# Patient Record
Sex: Male | Born: 1975 | Hispanic: Yes | Marital: Married | Smoking: Never smoker
Health system: Southern US, Community
[De-identification: ages and names within clinical notes are randomized; demographics above are authoritative.]

## PROBLEM LIST (undated history)

## (undated) DIAGNOSIS — E785 Hyperlipidemia, unspecified: Secondary | ICD-10-CM

## (undated) DIAGNOSIS — G43009 Migraine without aura, not intractable, without status migrainosus: Secondary | ICD-10-CM

## (undated) HISTORY — DX: Migraine without aura, not intractable, without status migrainosus: G43.009

## (undated) HISTORY — PX: WISDOM TOOTH EXTRACTION: SHX21

---

## 2015-06-28 ENCOUNTER — Encounter: Payer: Self-pay | Admitting: Emergency Medicine

## 2015-06-28 ENCOUNTER — Emergency Department: Payer: 59

## 2015-06-28 ENCOUNTER — Emergency Department
Admission: EM | Admit: 2015-06-28 | Discharge: 2015-06-28 | Disposition: A | Discharge status: Home / Self Care | Payer: 59 | Attending: Emergency Medicine | Admitting: Emergency Medicine

## 2015-06-28 DIAGNOSIS — Y9389 Activity, other specified: Secondary | ICD-10-CM | POA: Insufficient documentation

## 2015-06-28 DIAGNOSIS — T148 Other injury of unspecified body region: Secondary | ICD-10-CM | POA: Diagnosis not present

## 2015-06-28 DIAGNOSIS — S0001XA Abrasion of scalp, initial encounter: Secondary | ICD-10-CM | POA: Insufficient documentation

## 2015-06-28 DIAGNOSIS — S0990XA Unspecified injury of head, initial encounter: Secondary | ICD-10-CM | POA: Diagnosis not present

## 2015-06-28 DIAGNOSIS — Y92410 Unspecified street and highway as the place of occurrence of the external cause: Secondary | ICD-10-CM | POA: Diagnosis not present

## 2015-06-28 DIAGNOSIS — T07XXXA Unspecified multiple injuries, initial encounter: Secondary | ICD-10-CM

## 2015-06-28 DIAGNOSIS — S29001A Unspecified injury of muscle and tendon of front wall of thorax, initial encounter: Secondary | ICD-10-CM | POA: Diagnosis not present

## 2015-06-28 DIAGNOSIS — S8992XA Unspecified injury of left lower leg, initial encounter: Secondary | ICD-10-CM | POA: Insufficient documentation

## 2015-06-28 DIAGNOSIS — S8991XA Unspecified injury of right lower leg, initial encounter: Secondary | ICD-10-CM | POA: Insufficient documentation

## 2015-06-28 DIAGNOSIS — M62838 Other muscle spasm: Secondary | ICD-10-CM | POA: Insufficient documentation

## 2015-06-28 DIAGNOSIS — Y998 Other external cause status: Secondary | ICD-10-CM | POA: Insufficient documentation

## 2015-06-28 DIAGNOSIS — S199XXA Unspecified injury of neck, initial encounter: Secondary | ICD-10-CM | POA: Diagnosis present

## 2015-06-28 HISTORY — DX: Hyperlipidemia, unspecified: E78.5

## 2015-06-28 MED ORDER — METHOCARBAMOL 500 MG PO TABS: 500.0000 mg | ORAL_TABLET | Freq: Four times a day (QID) | ORAL | Status: DC

## 2015-06-28 MED ORDER — MELOXICAM 15 MG PO TABS: 15.0000 mg | ORAL_TABLET | Freq: Every day | ORAL | Status: DC
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
## 2015-06-28 NOTE — Discharge Instructions (Signed)
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
## 2018-03-07 ENCOUNTER — Encounter: Payer: Self-pay | Admitting: Cardiology

## 2018-03-08 ENCOUNTER — Other Ambulatory Visit: Payer: Self-pay | Admitting: Internal Medicine

## 2018-03-08 ENCOUNTER — Encounter (INDEPENDENT_AMBULATORY_CARE_PROVIDER_SITE_OTHER): Payer: Self-pay

## 2018-03-08 ENCOUNTER — Ambulatory Visit (INDEPENDENT_AMBULATORY_CARE_PROVIDER_SITE_OTHER): Payer: 59

## 2018-03-08 DIAGNOSIS — R55 Syncope and collapse: Secondary | ICD-10-CM | POA: Diagnosis not present

## 2018-03-28 ENCOUNTER — Encounter: Payer: Self-pay | Admitting: Cardiology

## 2018-03-28 ENCOUNTER — Ambulatory Visit (INDEPENDENT_AMBULATORY_CARE_PROVIDER_SITE_OTHER): Payer: 59 | Admitting: Cardiology

## 2018-03-28 VITALS — BP 120/70 | HR 58 | Ht 76.0 in | Wt 208.4 lb

## 2018-03-28 DIAGNOSIS — R55 Syncope and collapse: Secondary | ICD-10-CM

## 2018-03-28 NOTE — Patient Instructions (Signed)
Medication Instructions:  The current medical regimen is effective;  continue present plan and medications.  If you need a refill on your cardiac medications before your next appointment, please call your pharmacy.   Testing/Procedures: Your physician has requested that you have an echocardiogram. Echocardiography is a painless test that uses sound waves to create images of your heart. It provides your doctor with information about the size and shape of your heart and how well your heart's chambers and valves are working. This procedure takes approximately one hour. There are no restrictions for this procedure.  Your physician has requested that you have an exercise tolerance test. For further information please visit [email or web address]. Please also follow instruction sheet, as given.  Follow-Up: At [facility], you and your health needs are our priority.  As part of our continuing mission to provide you with exceptional heart care, we have created designated Provider Care Teams.  These Care Teams include your primary Cardiologist (physician) and Advanced Practice Providers (APPs -  Physician Assistants and Nurse Practitioners) who all work together to provide you with the care you need, when you need it. You will need a follow up appointment in 3 months.  Please call our office 2 months in advance to schedule this appointment.  You may see Donato SchultzMark Skains, MD or one of the following Advanced Practice Providers on your designated Care Team:   Norma FredricksonLori Gerhardt, NP Nada BoozerLaura Ingold, NP . Georgie ChardJill McDaniel, NP  Thank you for choosing [facility]!!
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
## 2018-04-06 ENCOUNTER — Ambulatory Visit (INDEPENDENT_AMBULATORY_CARE_PROVIDER_SITE_OTHER): Payer: 59

## 2018-04-06 ENCOUNTER — Ambulatory Visit (HOSPITAL_COMMUNITY): Payer: 59 | Attending: Cardiology

## 2018-04-06 ENCOUNTER — Other Ambulatory Visit: Payer: Self-pay

## 2018-04-06 DIAGNOSIS — R55 Syncope and collapse: Secondary | ICD-10-CM

## 2018-04-06 LAB — EXERCISE TOLERANCE TEST
CHL CUP RESTING HR STRESS: 65 {beats}/min
CHL RATE OF PERCEIVED EXERTION: 17
CSEPED: 11 min
CSEPEDS: 0 s
CSEPEW: 13.4 METS
CSEPHR: 96 %
MPHR: 177 {beats}/min
Peak HR: 171 {beats}/min

## 2018-06-14 ENCOUNTER — Encounter: Payer: Self-pay | Admitting: Cardiology

## 2018-06-30 ENCOUNTER — Encounter: Payer: Self-pay | Admitting: Cardiology

## 2018-06-30 ENCOUNTER — Ambulatory Visit (INDEPENDENT_AMBULATORY_CARE_PROVIDER_SITE_OTHER): Payer: 59 | Admitting: Cardiology

## 2018-06-30 VITALS — BP 124/88 | HR 61 | Ht 76.0 in | Wt 206.8 lb

## 2018-06-30 DIAGNOSIS — R55 Syncope and collapse: Secondary | ICD-10-CM | POA: Diagnosis not present

## 2018-06-30 NOTE — Patient Instructions (Signed)
Medication Instructions:  The current medical regimen is effective;  continue present plan and medications.  *If you need a refill on your cardiac medications before your next appointment, please call your pharmacy*  Follow-Up: Follow up as needed.  Thank you for choosing [facility]!!      
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
